# Patient Record
Sex: Male | Born: 1970
Health system: Southern US, Community
[De-identification: ages and names within clinical notes are randomized; demographics above are authoritative.]

## PROBLEM LIST (undated history)

## (undated) DIAGNOSIS — K219 Gastro-esophageal reflux disease without esophagitis: Secondary | ICD-10-CM

## (undated) HISTORY — PX: HAND SURGERY: SHX662

---

## 2001-04-13 ENCOUNTER — Emergency Department (HOSPITAL_COMMUNITY): Admission: EM | Admit: 2001-04-13 | Discharge: 2001-04-13 | Payer: Self-pay | Admitting: Emergency Medicine

## 2001-04-14 ENCOUNTER — Emergency Department (HOSPITAL_COMMUNITY): Admission: EM | Admit: 2001-04-14 | Discharge: 2001-04-14 | Payer: Self-pay | Admitting: Emergency Medicine

## 2006-02-19 ENCOUNTER — Encounter: Admission: RE | Admit: 2006-02-19 | Discharge: 2006-02-19 | Payer: Self-pay | Admitting: Internal Medicine

## 2008-03-09 ENCOUNTER — Encounter: Admission: RE | Admit: 2008-03-09 | Discharge: 2008-03-09 | Payer: Self-pay | Admitting: Specialist

## 2010-04-14 ENCOUNTER — Emergency Department (HOSPITAL_BASED_OUTPATIENT_CLINIC_OR_DEPARTMENT_OTHER)
Admission: EM | Admit: 2010-04-14 | Discharge: 2010-04-14 | Disposition: A | Discharge status: Home / Self Care | Payer: BC Managed Care – PPO | Attending: Emergency Medicine | Admitting: Emergency Medicine

## 2010-04-14 ENCOUNTER — Emergency Department (INDEPENDENT_AMBULATORY_CARE_PROVIDER_SITE_OTHER): Payer: BC Managed Care – PPO

## 2010-04-14 DIAGNOSIS — W219XXA Striking against or struck by unspecified sports equipment, initial encounter: Secondary | ICD-10-CM | POA: Insufficient documentation

## 2010-04-14 DIAGNOSIS — S62329A Displaced fracture of shaft of unspecified metacarpal bone, initial encounter for closed fracture: Secondary | ICD-10-CM | POA: Insufficient documentation

## 2010-04-14 DIAGNOSIS — Y9367 Activity, basketball: Secondary | ICD-10-CM

## 2015-05-23 ENCOUNTER — Other Ambulatory Visit: Payer: Self-pay | Admitting: Gastroenterology

## 2015-06-18 ENCOUNTER — Encounter (HOSPITAL_COMMUNITY): Payer: Self-pay | Admitting: *Deleted

## 2015-06-24 NOTE — Anesthesia Preprocedure Evaluation (Addendum)
Anesthesia Evaluation  Patient identified by MRN, date of birth, ID band Patient awake    Reviewed: Allergy & Precautions, NPO status , Patient's Chart, lab work & pertinent test results  History of Anesthesia Complications Negative for: history of anesthetic complications  Airway Mallampati: I       Dental  (+) Teeth Intact   Pulmonary neg pulmonary ROS,    breath sounds clear to auscultation       Cardiovascular negative cardio ROS   Rhythm:Regular Rate:Normal     Neuro/Psych negative neurological ROS     GI/Hepatic Neg liver ROS, GERD  ,  Endo/Other  negative endocrine ROS  Renal/GU negative Renal ROS     Musculoskeletal   Abdominal   Peds  Hematology negative hematology ROS (+)   Anesthesia Other Findings   Reproductive/Obstetrics                            Anesthesia Physical Anesthesia Plan  ASA: I  Anesthesia Plan: MAC   Post-op Pain Management:    Induction: Intravenous  Airway Management Planned: Natural Airway and Nasal Cannula  Additional Equipment:   Intra-op Plan:   Post-operative Plan:   Informed Consent: I have reviewed the patients History and Physical, chart, labs and discussed the procedure including the risks, benefits and alternatives for the proposed anesthesia with the patient or authorized representative who has indicated his/her understanding and acceptance.     Plan Discussed with: CRNA and Surgeon  Anesthesia Plan Comments:         Anesthesia Quick Evaluation

## 2015-06-25 ENCOUNTER — Ambulatory Visit (HOSPITAL_COMMUNITY): Payer: BC Managed Care – PPO | Admitting: Anesthesiology

## 2015-06-25 ENCOUNTER — Encounter (HOSPITAL_COMMUNITY): Payer: Self-pay | Admitting: Anesthesiology

## 2015-06-25 ENCOUNTER — Ambulatory Visit (HOSPITAL_COMMUNITY)
Admission: RE | Admit: 2015-06-25 | Discharge: 2015-06-25 | Disposition: A | Discharge status: Home / Self Care | Payer: BC Managed Care – PPO | Source: Ambulatory Visit | Attending: Gastroenterology | Admitting: Gastroenterology

## 2015-06-25 ENCOUNTER — Encounter (HOSPITAL_COMMUNITY)
Admission: RE | Disposition: A | Discharge status: Home / Self Care | Payer: Self-pay | Source: Ambulatory Visit | Attending: Gastroenterology

## 2015-06-25 DIAGNOSIS — D123 Benign neoplasm of transverse colon: Secondary | ICD-10-CM | POA: Insufficient documentation

## 2015-06-25 DIAGNOSIS — Z1211 Encounter for screening for malignant neoplasm of colon: Secondary | ICD-10-CM | POA: Diagnosis present

## 2015-06-25 DIAGNOSIS — D122 Benign neoplasm of ascending colon: Secondary | ICD-10-CM | POA: Insufficient documentation

## 2015-06-25 DIAGNOSIS — Z800 Family history of malignant neoplasm of digestive organs: Secondary | ICD-10-CM | POA: Insufficient documentation

## 2015-06-25 HISTORY — DX: Gastro-esophageal reflux disease without esophagitis: K21.9

## 2015-06-25 HISTORY — PX: COLONOSCOPY WITH PROPOFOL: SHX5780

## 2015-06-25 SURGERY — COLONOSCOPY WITH PROPOFOL
Anesthesia: Monitor Anesthesia Care

## 2015-06-25 MED ORDER — PROPOFOL 500 MG/50ML IV EMUL
INTRAVENOUS | Status: DC | PRN
  Administered 2015-06-25: 200 ug/kg/min via INTRAVENOUS

## 2015-06-25 MED ORDER — MIDAZOLAM HCL 2 MG/2ML IJ SOLN
INTRAMUSCULAR | Status: AC
Start: 2015-06-25 — End: 2015-06-25
  Filled 2015-06-25: qty 2

## 2015-06-25 MED ORDER — PROPOFOL 10 MG/ML IV BOLUS
INTRAVENOUS | Status: AC
  Filled 2015-06-25: qty 40

## 2015-06-25 MED ORDER — MIDAZOLAM HCL 5 MG/5ML IJ SOLN
INTRAMUSCULAR | Status: DC | PRN
  Administered 2015-06-25: 2 mg via INTRAVENOUS

## 2015-06-25 MED ORDER — PHENYLEPHRINE 40 MCG/ML (10ML) SYRINGE FOR IV PUSH (FOR BLOOD PRESSURE SUPPORT)
PREFILLED_SYRINGE | INTRAVENOUS | Status: AC
  Filled 2015-06-25: qty 20

## 2015-06-25 MED ORDER — EPHEDRINE SULFATE 50 MG/ML IJ SOLN
INTRAMUSCULAR | Status: AC
  Filled 2015-06-25: qty 1

## 2015-06-25 MED ORDER — LACTATED RINGERS IV SOLN
INTRAVENOUS | Status: DC | PRN
  Administered 2015-06-25: 07:00:00 via INTRAVENOUS

## 2015-06-25 MED ORDER — SODIUM CHLORIDE 0.9 % IV SOLN: INTRAVENOUS | Status: DC

## 2015-06-25 MED ORDER — PROPOFOL 500 MG/50ML IV EMUL
INTRAVENOUS | Status: DC | PRN
  Administered 2015-06-25: 50 mg via INTRAVENOUS

## 2015-06-25 MED ORDER — LACTATED RINGERS IV SOLN
INTRAVENOUS | Status: DC
  Administered 2015-06-25: 1000 mL via INTRAVENOUS

## 2015-06-25 MED ORDER — GLYCOPYRROLATE 0.2 MG/ML IJ SOLN
INTRAMUSCULAR | Status: AC
  Filled 2015-06-25: qty 2

## 2015-06-25 MED ORDER — GLYCOPYRROLATE 0.2 MG/ML IJ SOLN
INTRAMUSCULAR | Status: DC | PRN
  Administered 2015-06-25: 0.2 mg via INTRAVENOUS

## 2015-06-25 MED ORDER — PROPOFOL 10 MG/ML IV BOLUS
INTRAVENOUS | Status: AC
  Filled 2015-06-25: qty 20

## 2015-06-25 MED ORDER — SODIUM CHLORIDE 0.9 % IJ SOLN
INTRAMUSCULAR | Status: AC
  Filled 2015-06-25: qty 10

## 2015-06-25 SURGICAL SUPPLY — 22 items

## 2015-06-25 NOTE — Transfer of Care (Signed)
Immediate Anesthesia Transfer of Care Note  Patient: Jeffery Bell  Procedure(s) Performed: Procedure(s): COLONOSCOPY WITH PROPOFOL (N/A)  Patient Location: PACU  Anesthesia Type:MAC  Level of Consciousness:  sedated, patient cooperative and responds to stimulation  Airway & Oxygen Therapy:Patient Spontanous Breathing and Patient connected to face mask oxgen  Post-op Assessment:  Report given to PACU RN and Post -op Vital signs reviewed and stable  Post vital signs:  Reviewed and stable  Last Vitals:  Filed Vitals:   06/25/15 0646  BP: 140/79  Pulse: 65  Temp: 36.6 C  Resp: 20    Complications: No apparent anesthesia complications

## 2015-06-25 NOTE — Op Note (Signed)
[facility] Patient Name: Jeffery Bell Procedure Date: 06/25/2015 MRN: [id number] Attending MD: Garlan Fair , MD Date of Birth: 03-10-1970 CSN: [id number] Age: 45 Admit Type: Outpatient Procedure:                Colonoscopy Indications:              Screening in patient at increased risk: Brother                            diagnosed with colon cancer at age 35. Providers:                Garlan Fair, MD, Laverta Baltimore, RN, Cherylynn Ridges, Technician, Arnoldo Hooker, CRNA Referring MD:              Medicines:                Propofol per Anesthesia Complications:            No immediate complications. Estimated Blood Loss:     Estimated blood loss: none. Procedure:                Pre-Anesthesia Assessment:                           - Prior to the procedure, a History and Physical                            was performed, and patient medications and                            allergies were reviewed. The patient's tolerance of                            previous anesthesia was also reviewed. The risks                            and benefits of the procedure and the sedation                            options and risks were discussed with the patient.                            All questions were answered, and informed consent                            was obtained. Prior Anticoagulants: The patient has                            taken no previous anticoagulant or antiplatelet                            agents. ASA Grade Assessment: II - A patient with  mild systemic disease. After reviewing the risks                            and benefits, the patient was deemed in                            satisfactory condition to undergo the procedure.                           After obtaining informed consent, the colonoscope                            was passed under direct vision. Throughout the         procedure, the patient's blood pressure, pulse, and                            oxygen saturations were monitored continuously. The                            [id number] scope was introduced through                            the anus and advanced to the the cecum, identified                            by appendiceal orifice and ileocecal valve. The                            colonoscopy was performed without difficulty. The                            patient tolerated the procedure well. The quality                            of the bowel preparation was good. The terminal                            ileum, the ileocecal valve, the appendiceal orifice                            and the rectum were photographed. Scope In: 7:25:28 AM Scope Out: 7:45:09 AM Scope Withdrawal Time: 0 hours 11 minutes 12 seconds  Total Procedure Duration: 0 hours 19 minutes 41 seconds  Findings:      The perianal and digital rectal examinations were normal.      A 5 mm polyp was found in the distal transverse colon. The polyp was       sessile. The polyp was removed with a cold snare. Resection and       retrieval were complete.      A 3 mm polyp was found in the mid ascending colon. The polyp was       sessile. The polyp was removed with a cold biopsy forceps. Resection and       retrieval were complete.      The exam was  otherwise without abnormality. Impression:               - One 5 mm polyp in the distal transverse colon,                            removed with a cold snare. Resected and retrieved.                           - One 3 mm polyp in the mid ascending colon,                            removed with a cold biopsy forceps. Resected and                            retrieved.                           - The examination was otherwise normal. Moderate Sedation:      N/A- Per Anesthesia Care Recommendation:           - Patient has a contact number available for                             emergencies. The signs and symptoms of potential                            delayed complications were discussed with the                            patient. Return to normal activities tomorrow.                            Written discharge instructions were provided to the                            patient.                           - Repeat colonoscopy in 5 years for surveillance.                           - Resume previous diet.                           - Continue present medications. Procedure Code(s):        --- Professional ---                           [phone], Colonoscopy, flexible; with removal of                            tumor(s), polyp(s), or other lesion(s) by snare                            technique  [id number], 16, Colonoscopy, flexible; with biopsy,                            single or multiple Diagnosis Code(s):        --- Professional ---                           Z80.0, Family history of malignant neoplasm of                            digestive organs                           D12.3, Benign neoplasm of transverse colon (hepatic                            flexure or splenic flexure)                           D12.2, Benign neoplasm of ascending colon CPT copyright 2016 [employer]. All rights reserved. The codes documented in this report are preliminary and upon coder review may  be revised to meet current compliance requirements. Earle Gell, MD Garlan Fair, MD 06/25/2015 7:53:02 AM This report has been signed electronically. Number of Addenda: 0

## 2015-06-25 NOTE — Anesthesia Postprocedure Evaluation (Signed)
Anesthesia Post Note  Patient: Jeffery Bell  Procedure(s) Performed: Procedure(s) (LRB): COLONOSCOPY WITH PROPOFOL (N/A)  Patient location during evaluation: Endoscopy Anesthesia Type: MAC Level of consciousness: awake and alert Pain management: pain level controlled Vital Signs Assessment: post-procedure vital signs reviewed and stable Respiratory status: spontaneous breathing, nonlabored ventilation, respiratory function stable and patient connected to nasal cannula oxygen Cardiovascular status: stable and blood pressure returned to baseline Anesthetic complications: no    Last Vitals:  Filed Vitals:   06/25/15 0807 06/25/15 0810  BP: 113/71 112/66  Pulse: 53 49  Temp:    Resp: 21 17    Last Pain: There were no vitals filed for this visit.               Glynda Soliday,Zyeir TERRILL

## 2015-06-25 NOTE — Discharge Instructions (Signed)

## 2015-06-25 NOTE — H&P (Signed)
  Procedure: Screening colonoscopy. Brother was diagnosed with colon cancer at age 45. Normal screening colonoscopy was performed on 04/02/2010  History: The patient is a 45 year old male born Jul 03, 1970. He is scheduled to undergo a repeat screening colonoscopy today.  Past medical history: Gastroesophageal reflux. Hand surgery.  Medication allergies: Penicillin caused hives  Family history: Brother diagnosed with colon cancer at age 70  Exam: The patient is alert and lying comfortably on the endoscopy stretcher. Abdomen is soft and nontender to palpation. Lungs are clear to auscultation. Cardiac exam reveals a regular rhythm.  Plan: Proceed with screening colonoscopy

## 2015-06-26 ENCOUNTER — Encounter (HOSPITAL_COMMUNITY): Payer: Self-pay | Admitting: Gastroenterology

## 2016-03-25 ENCOUNTER — Ambulatory Visit
Admission: RE | Admit: 2016-03-25 | Discharge: 2016-03-25 | Disposition: A | Discharge status: Home / Self Care | Payer: BC Managed Care – PPO | Source: Ambulatory Visit | Attending: Nurse Practitioner | Admitting: Nurse Practitioner

## 2016-03-25 ENCOUNTER — Other Ambulatory Visit: Payer: Self-pay | Admitting: Nurse Practitioner

## 2016-03-25 DIAGNOSIS — R059 Cough, unspecified: Secondary | ICD-10-CM

## 2016-03-25 DIAGNOSIS — R05 Cough: Secondary | ICD-10-CM

## 2017-06-29 ENCOUNTER — Other Ambulatory Visit: Payer: Self-pay

## 2018-04-22 ENCOUNTER — Other Ambulatory Visit: Payer: Self-pay | Admitting: Nurse Practitioner

## 2018-04-22 ENCOUNTER — Other Ambulatory Visit: Payer: Self-pay

## 2018-04-22 ENCOUNTER — Ambulatory Visit
Admission: RE | Admit: 2018-04-22 | Discharge: 2018-04-22 | Disposition: A | Discharge status: Home / Self Care | Payer: BC Managed Care – PPO | Source: Ambulatory Visit | Attending: Nurse Practitioner | Admitting: Nurse Practitioner

## 2018-04-22 DIAGNOSIS — J40 Bronchitis, not specified as acute or chronic: Secondary | ICD-10-CM

## 2020-04-04 ENCOUNTER — Other Ambulatory Visit (HOSPITAL_COMMUNITY): Payer: Self-pay | Admitting: Internal Medicine

## 2020-05-26 IMAGING — DX CHEST - 2 VIEW
2 series · 2 of 2 positions shown · non-contrast
Comparison: March 25, 2016

CLINICAL DATA: Cough and congestion with chest tightness. Shortness
of breath

EXAM:
CHEST - 2 VIEW

[dg chest 2 view (1 of 2)]
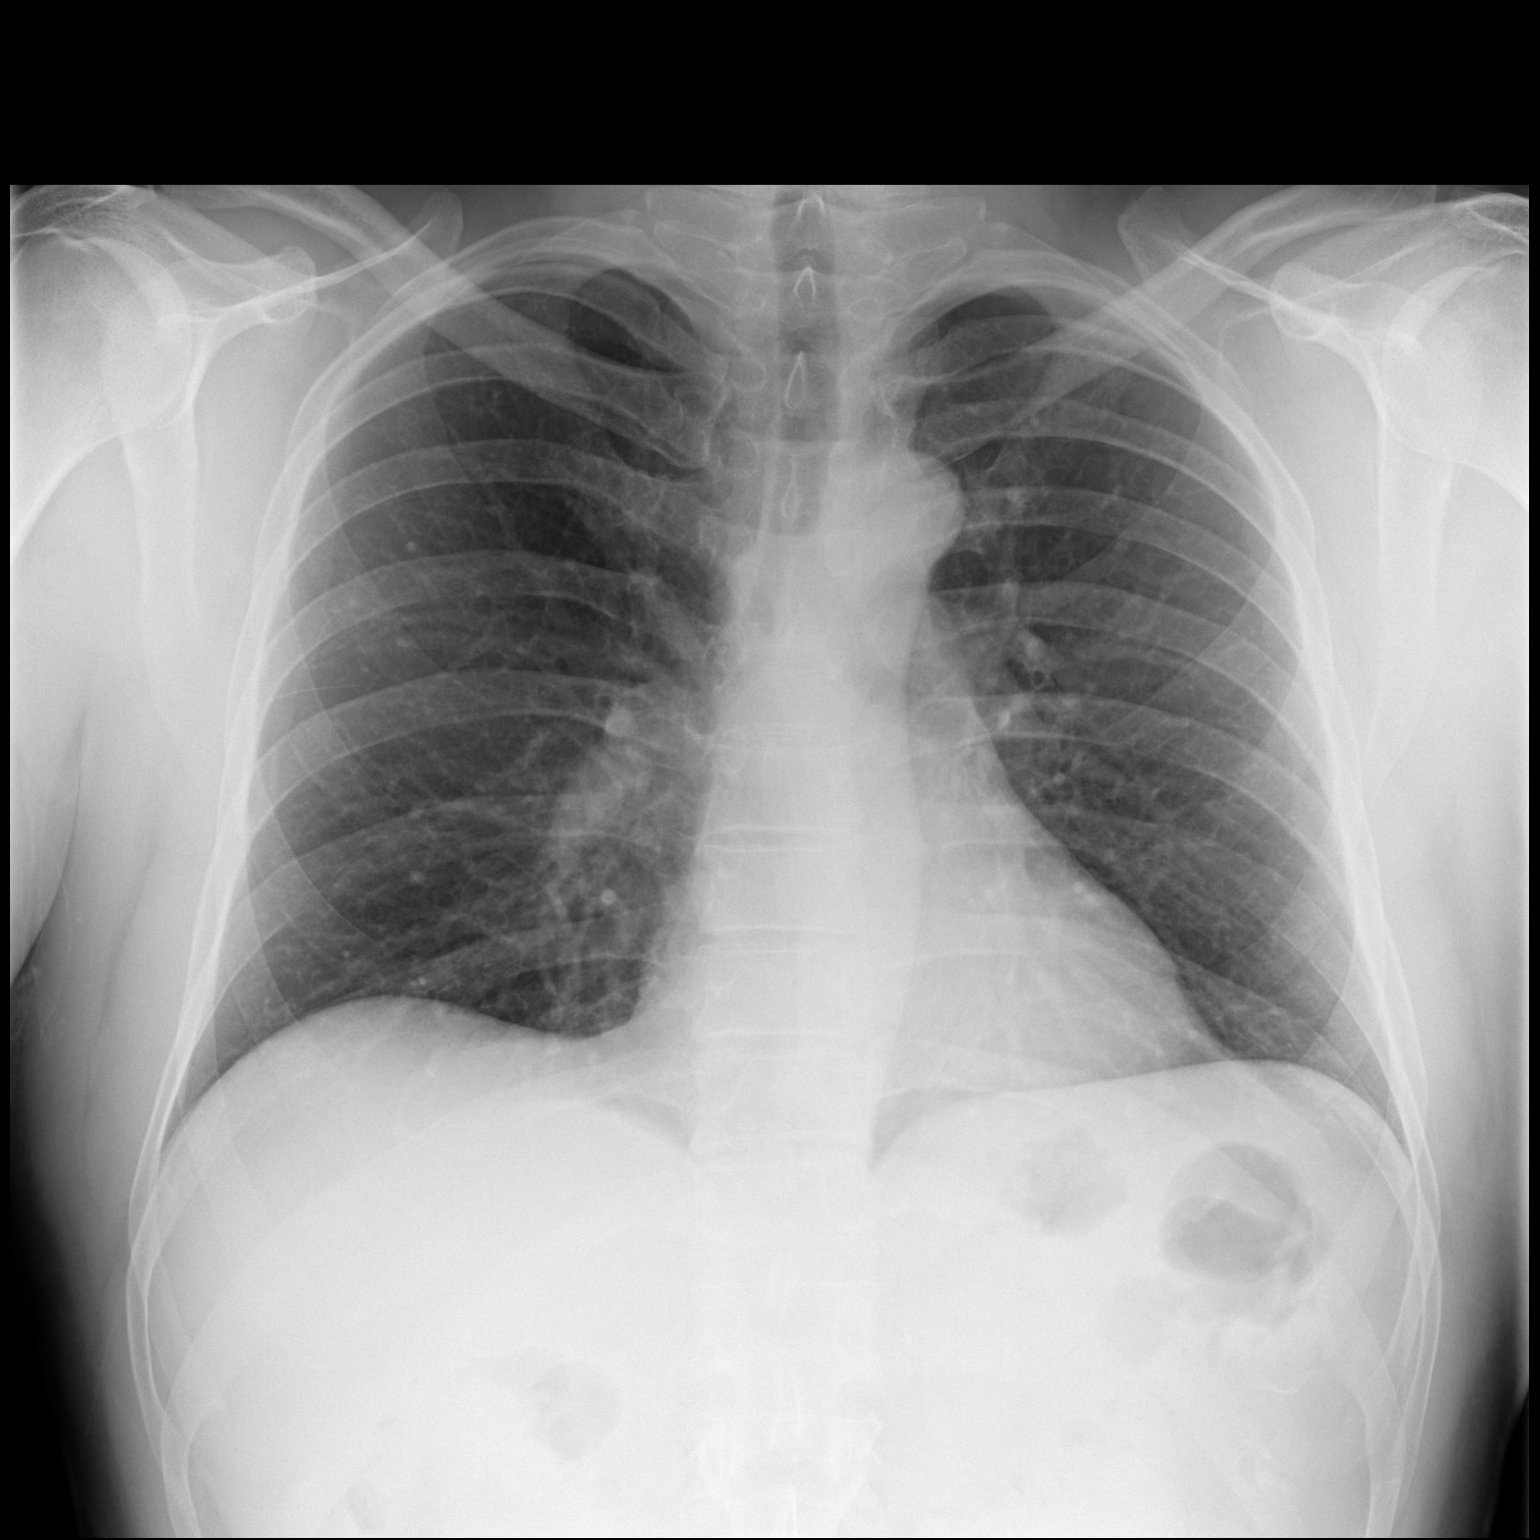

[dg chest 2 view (2 of 2)]
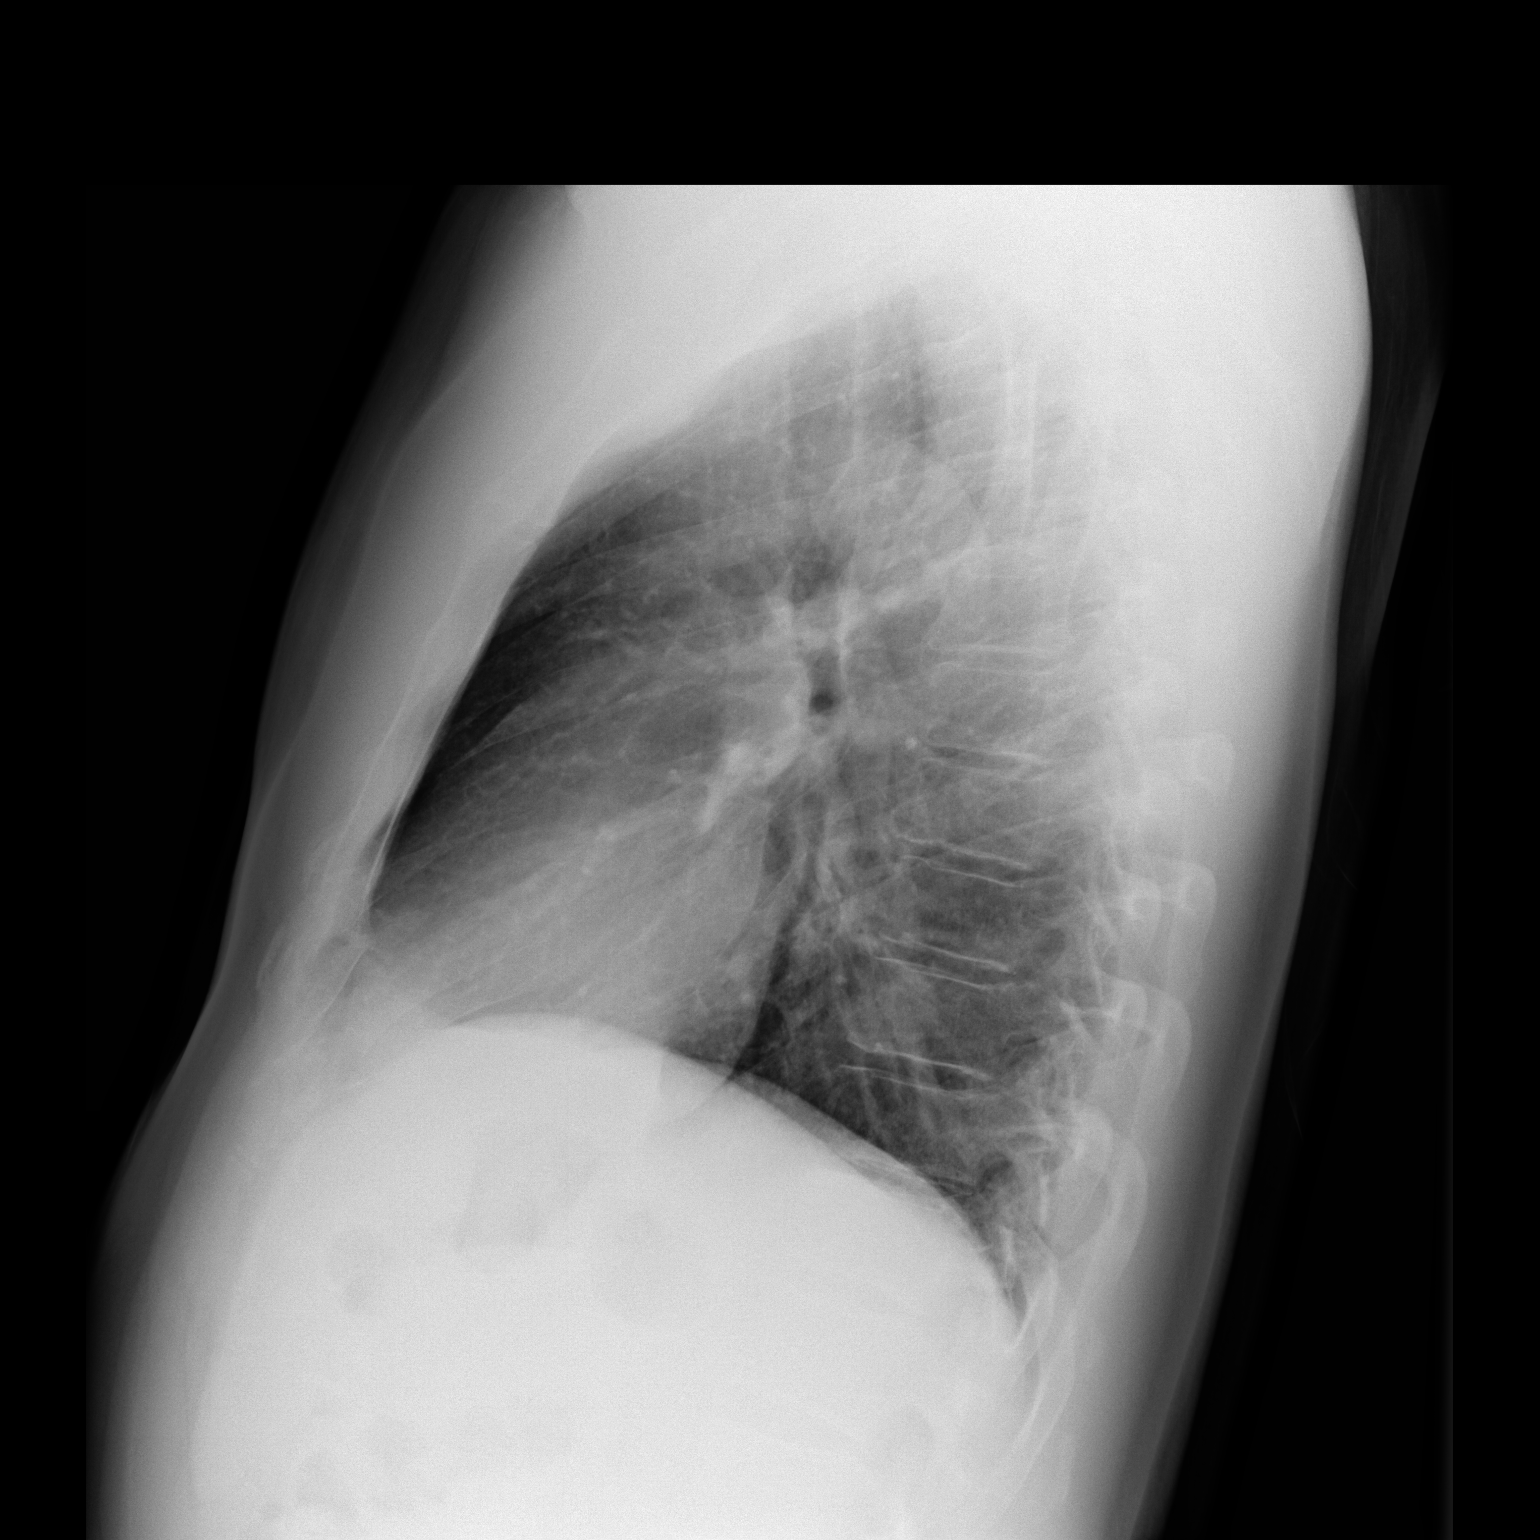

[2 of 2 positions shown; findings below may reference images not displayed]

FINDINGS: No edema or consolidation. The heart size and pulmonary vascularity
are normal. No adenopathy. No bone lesions.
IMPRESSION: No edema or consolidation.

## 2020-06-04 ENCOUNTER — Other Ambulatory Visit (HOSPITAL_COMMUNITY): Payer: Self-pay

## 2020-06-04 MED ORDER — ESCITALOPRAM OXALATE 10 MG PO TABS
ORAL_TABLET | ORAL | 1 refills | Status: AC
  Filled 2020-07-10: qty 90, 90d supply, fill #0

## 2020-06-04 MED ORDER — HYDROXYZINE HCL 25 MG PO TABS
ORAL_TABLET | ORAL | 2 refills | Status: AC
  Filled 2020-06-04: qty 60, 30d supply, fill #0

## 2020-06-04 MED FILL — Escitalopram Oxalate Tab 10 MG (Base Equiv): ORAL | 30 days supply | Qty: 30 | Fill #0 | Status: AC

## 2020-07-10 ENCOUNTER — Other Ambulatory Visit (HOSPITAL_COMMUNITY): Payer: Self-pay

## 2020-07-17 ENCOUNTER — Other Ambulatory Visit (HOSPITAL_COMMUNITY): Payer: Self-pay

## 2023-07-21 ENCOUNTER — Other Ambulatory Visit (HOSPITAL_COMMUNITY): Payer: Self-pay | Admitting: Internal Medicine

## 2023-07-21 DIAGNOSIS — E785 Hyperlipidemia, unspecified: Secondary | ICD-10-CM

## 2023-08-07 ENCOUNTER — Ambulatory Visit (HOSPITAL_COMMUNITY)
Admission: RE | Admit: 2023-08-07 | Discharge: 2023-08-07 | Disposition: A | Discharge status: Home / Self Care | Payer: Self-pay | Source: Ambulatory Visit | Attending: Internal Medicine | Admitting: Internal Medicine

## 2023-08-07 DIAGNOSIS — E785 Hyperlipidemia, unspecified: Secondary | ICD-10-CM | POA: Insufficient documentation

## 2023-11-11 ENCOUNTER — Other Ambulatory Visit: Payer: Self-pay | Admitting: Medical Genetics

## 2023-12-15 ENCOUNTER — Other Ambulatory Visit: Payer: Self-pay

## 2023-12-15 DIAGNOSIS — Z006 Encounter for examination for normal comparison and control in clinical research program: Secondary | ICD-10-CM

## 2023-12-29 LAB — GENECONNECT MOLECULAR SCREEN: Genetic Analysis Overall Interpretation: NEGATIVE
